# Patient Record
Sex: Female | Born: 1965 | Race: White | Hispanic: No | Marital: Married
Health system: Southern US, Community
[De-identification: ages and names within clinical notes are randomized; demographics above are authoritative.]

---

## 1997-10-06 ENCOUNTER — Inpatient Hospital Stay (HOSPITAL_COMMUNITY): Admission: AD | Admit: 1997-10-06 | Discharge: 1997-10-08 | Payer: Self-pay | Admitting: Obstetrics & Gynecology

## 1997-11-10 ENCOUNTER — Other Ambulatory Visit: Admission: RE | Admit: 1997-11-10 | Discharge: 1997-11-10 | Payer: Self-pay | Admitting: Obstetrics & Gynecology

## 1998-12-11 ENCOUNTER — Other Ambulatory Visit: Admission: RE | Admit: 1998-12-11 | Discharge: 1998-12-11 | Payer: Self-pay | Admitting: Obstetrics and Gynecology

## 2000-01-07 ENCOUNTER — Other Ambulatory Visit: Admission: RE | Admit: 2000-01-07 | Discharge: 2000-01-07 | Payer: Self-pay | Admitting: Obstetrics & Gynecology

## 2001-02-09 ENCOUNTER — Other Ambulatory Visit: Admission: RE | Admit: 2001-02-09 | Discharge: 2001-02-09 | Payer: Self-pay | Admitting: Obstetrics & Gynecology

## 2002-06-14 ENCOUNTER — Other Ambulatory Visit: Admission: RE | Admit: 2002-06-14 | Discharge: 2002-06-14 | Payer: Self-pay | Admitting: Obstetrics and Gynecology

## 2003-08-12 ENCOUNTER — Other Ambulatory Visit: Admission: RE | Admit: 2003-08-12 | Discharge: 2003-08-12 | Payer: Self-pay | Admitting: Obstetrics and Gynecology

## 2004-12-07 ENCOUNTER — Other Ambulatory Visit: Admission: RE | Admit: 2004-12-07 | Discharge: 2004-12-07 | Payer: Self-pay | Admitting: Obstetrics & Gynecology

## 2013-07-18 ENCOUNTER — Other Ambulatory Visit: Payer: Self-pay | Admitting: Obstetrics & Gynecology

## 2013-07-18 DIAGNOSIS — Z803 Family history of malignant neoplasm of breast: Secondary | ICD-10-CM

## 2013-08-13 ENCOUNTER — Ambulatory Visit
Admission: RE | Admit: 2013-08-13 | Discharge: 2013-08-13 | Disposition: A | Discharge status: Home / Self Care | Payer: 59 | Source: Ambulatory Visit | Attending: Obstetrics & Gynecology | Admitting: Obstetrics & Gynecology

## 2013-08-13 DIAGNOSIS — Z803 Family history of malignant neoplasm of breast: Secondary | ICD-10-CM

## 2013-08-13 MED ORDER — GADOBENATE DIMEGLUMINE 529 MG/ML IV SOLN
10.0000 mL | Freq: Once | INTRAVENOUS | Status: AC | PRN
  Administered 2013-08-13: 10 mL via INTRAVENOUS

## 2014-09-05 ENCOUNTER — Other Ambulatory Visit: Payer: Self-pay | Admitting: Obstetrics & Gynecology

## 2014-09-05 DIAGNOSIS — Z803 Family history of malignant neoplasm of breast: Secondary | ICD-10-CM

## 2014-09-05 DIAGNOSIS — R922 Inconclusive mammogram: Secondary | ICD-10-CM

## 2014-10-06 ENCOUNTER — Ambulatory Visit
Admission: RE | Admit: 2014-10-06 | Discharge: 2014-10-06 | Disposition: A | Discharge status: Home / Self Care | Payer: Self-pay | Source: Ambulatory Visit | Attending: Obstetrics & Gynecology | Admitting: Obstetrics & Gynecology

## 2014-10-06 DIAGNOSIS — R922 Inconclusive mammogram: Secondary | ICD-10-CM

## 2014-10-06 DIAGNOSIS — Z803 Family history of malignant neoplasm of breast: Secondary | ICD-10-CM

## 2014-10-06 MED ORDER — GADOBENATE DIMEGLUMINE 529 MG/ML IV SOLN
10.0000 mL | Freq: Once | INTRAVENOUS | Status: AC | PRN
  Administered 2014-10-06: 10 mL via INTRAVENOUS

## 2015-09-03 ENCOUNTER — Other Ambulatory Visit: Payer: Self-pay | Admitting: Obstetrics & Gynecology

## 2015-09-03 DIAGNOSIS — Z803 Family history of malignant neoplasm of breast: Secondary | ICD-10-CM

## 2015-10-01 ENCOUNTER — Ambulatory Visit
Admission: RE | Admit: 2015-10-01 | Discharge: 2015-10-01 | Disposition: A | Discharge status: Home / Self Care | Payer: 59 | Source: Ambulatory Visit | Attending: Obstetrics & Gynecology | Admitting: Obstetrics & Gynecology

## 2015-10-01 DIAGNOSIS — Z803 Family history of malignant neoplasm of breast: Secondary | ICD-10-CM

## 2015-10-01 MED ORDER — GADOBENATE DIMEGLUMINE 529 MG/ML IV SOLN: 10.0000 mL | Freq: Once | INTRAVENOUS | Status: DC | PRN

## 2016-09-22 ENCOUNTER — Other Ambulatory Visit: Payer: Self-pay | Admitting: Obstetrics & Gynecology

## 2016-09-22 DIAGNOSIS — Z803 Family history of malignant neoplasm of breast: Secondary | ICD-10-CM

## 2016-10-03 ENCOUNTER — Ambulatory Visit
Admission: RE | Admit: 2016-10-03 | Discharge: 2016-10-03 | Disposition: A | Discharge status: Home / Self Care | Payer: 59 | Source: Ambulatory Visit | Attending: Obstetrics & Gynecology | Admitting: Obstetrics & Gynecology

## 2016-10-03 DIAGNOSIS — Z803 Family history of malignant neoplasm of breast: Secondary | ICD-10-CM

## 2016-10-03 MED ORDER — GADOBENATE DIMEGLUMINE 529 MG/ML IV SOLN
10.0000 mL | Freq: Once | INTRAVENOUS | Status: AC | PRN
  Administered 2016-10-03: 10 mL via INTRAVENOUS

## 2017-09-18 ENCOUNTER — Other Ambulatory Visit: Payer: Self-pay | Admitting: Obstetrics & Gynecology

## 2017-09-18 DIAGNOSIS — Z803 Family history of malignant neoplasm of breast: Secondary | ICD-10-CM

## 2017-10-16 ENCOUNTER — Ambulatory Visit
Admission: RE | Admit: 2017-10-16 | Discharge: 2017-10-16 | Disposition: A | Discharge status: Home / Self Care | Payer: Self-pay | Source: Ambulatory Visit | Attending: Obstetrics & Gynecology | Admitting: Obstetrics & Gynecology

## 2017-10-16 DIAGNOSIS — Z803 Family history of malignant neoplasm of breast: Secondary | ICD-10-CM

## 2017-10-16 MED ORDER — GADOBENATE DIMEGLUMINE 529 MG/ML IV SOLN
10.0000 mL | Freq: Once | INTRAVENOUS | Status: AC | PRN
  Administered 2017-10-16: 10 mL via INTRAVENOUS

## 2019-08-09 ENCOUNTER — Other Ambulatory Visit: Payer: Self-pay | Admitting: Obstetrics & Gynecology

## 2019-08-14 ENCOUNTER — Other Ambulatory Visit: Payer: Self-pay | Admitting: Obstetrics & Gynecology

## 2019-08-14 DIAGNOSIS — Z803 Family history of malignant neoplasm of breast: Secondary | ICD-10-CM

## 2019-09-09 ENCOUNTER — Other Ambulatory Visit: Payer: Managed Care, Other (non HMO)

## 2019-09-12 ENCOUNTER — Other Ambulatory Visit: Payer: Managed Care, Other (non HMO)

## 2020-01-14 ENCOUNTER — Other Ambulatory Visit: Payer: BC Managed Care – PPO

## 2020-01-20 ENCOUNTER — Other Ambulatory Visit: Payer: Self-pay | Admitting: Obstetrics & Gynecology

## 2020-01-20 DIAGNOSIS — Z803 Family history of malignant neoplasm of breast: Secondary | ICD-10-CM

## 2020-01-30 ENCOUNTER — Other Ambulatory Visit: Payer: BC Managed Care – PPO

## 2020-02-11 ENCOUNTER — Other Ambulatory Visit: Payer: Self-pay

## 2020-02-11 ENCOUNTER — Ambulatory Visit
Admission: RE | Admit: 2020-02-11 | Discharge: 2020-02-11 | Disposition: A | Discharge status: Home / Self Care | Payer: No Typology Code available for payment source | Source: Ambulatory Visit | Attending: Obstetrics & Gynecology | Admitting: Obstetrics & Gynecology

## 2020-02-11 DIAGNOSIS — Z803 Family history of malignant neoplasm of breast: Secondary | ICD-10-CM

## 2020-02-11 MED ORDER — GADOBUTROL 1 MMOL/ML IV SOLN
5.0000 mL | Freq: Once | INTRAVENOUS | Status: AC | PRN
  Administered 2020-02-11: 5 mL via INTRAVENOUS

## 2021-08-04 ENCOUNTER — Other Ambulatory Visit: Payer: Self-pay | Admitting: Obstetrics & Gynecology

## 2021-08-04 DIAGNOSIS — Z803 Family history of malignant neoplasm of breast: Secondary | ICD-10-CM

## 2021-08-25 ENCOUNTER — Ambulatory Visit
Admission: RE | Admit: 2021-08-25 | Discharge: 2021-08-25 | Disposition: A | Discharge status: Home / Self Care | Payer: Self-pay | Source: Ambulatory Visit | Attending: Obstetrics & Gynecology | Admitting: Obstetrics & Gynecology

## 2021-08-25 DIAGNOSIS — Z803 Family history of malignant neoplasm of breast: Secondary | ICD-10-CM

## 2021-08-25 MED ORDER — GADOBUTROL 1 MMOL/ML IV SOLN
5.0000 mL | Freq: Once | INTRAVENOUS | Status: AC | PRN
  Administered 2021-08-25: 5 mL via INTRAVENOUS

## 2022-05-22 IMAGING — MR MR BREAST BILAT WO/W CM
8 of 12 series · 33 of 48 positions shown · IV contrast (gadavist)
Comparison: Previous exam(s).

CLINICAL DATA: Patient with elevated lifetime risk of breast
cancer.

EXAM:
BILATERAL BREAST MRI WITH AND WITHOUT CONTRAST
TECHNIQUE: Multiplanar, multisequence MR images of both breasts were obtained
prior to and following the intravenous administration of 5 ml of
Gadavist

[Series 2: t2_tirm_tra ipat (a-p) · axial · 3.0mm · 0.70mm/px · 1 of 58 slices shown]
[im 1/58]
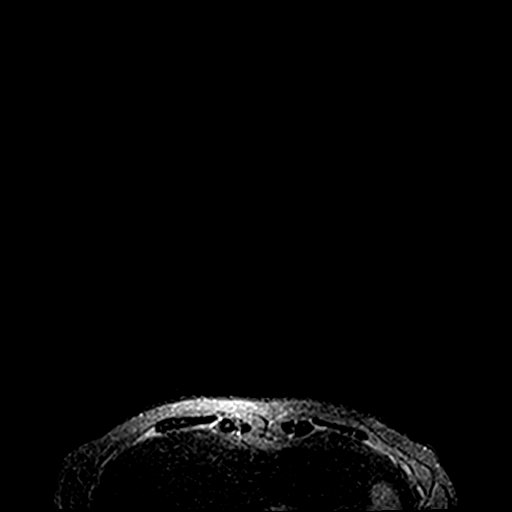

[Series 3: fl3d pre-cm no · axial · non-contrast · 1.2mm · 0.94mm/px · z∈[-64,+107]mm · 5 of 144 slices shown]
[im 1/144]
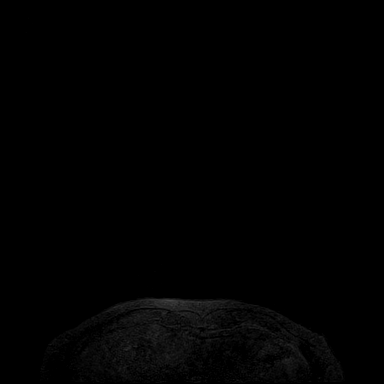
[im 36/144]
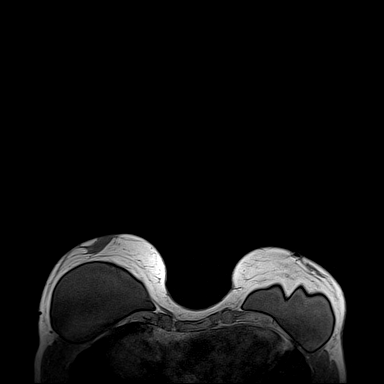
[im 72/144]
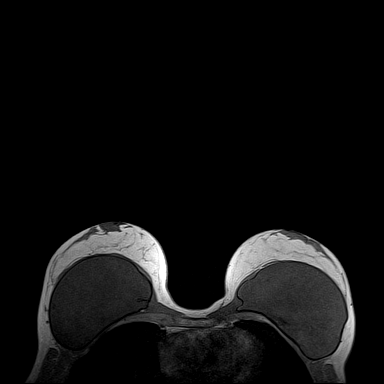
[im 108/144]
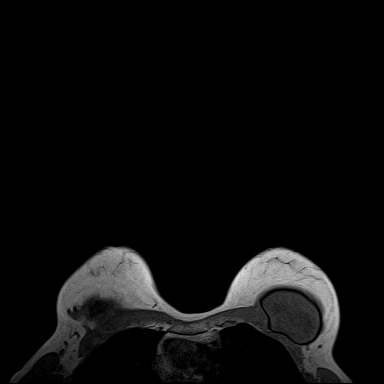
[im 144/144]
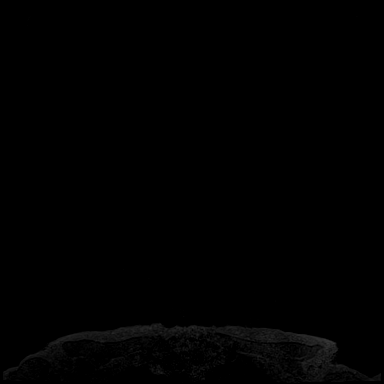

[Series 4: fl3d pre-cm · axial · non-contrast · 1.2mm · 0.94mm/px · z∈[-64,+107]mm · 5 of 144 slices shown]
[im 1/144]
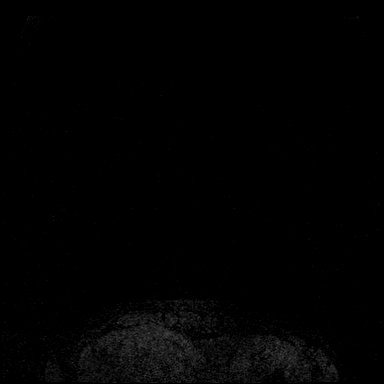
[im 36/144]
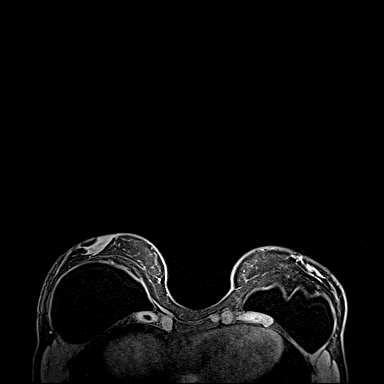
[im 72/144]
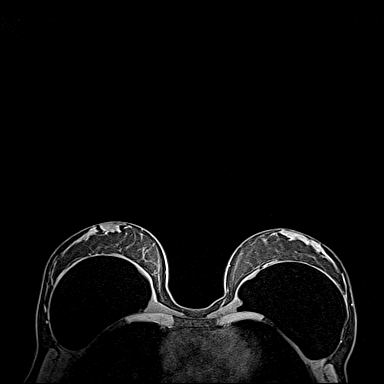
[im 108/144]
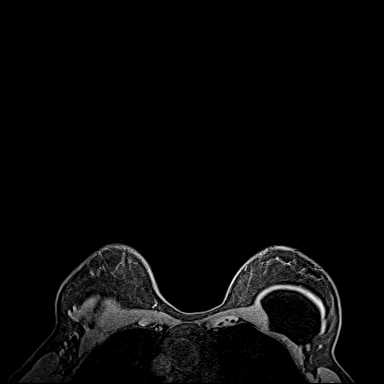
[im 144/144]
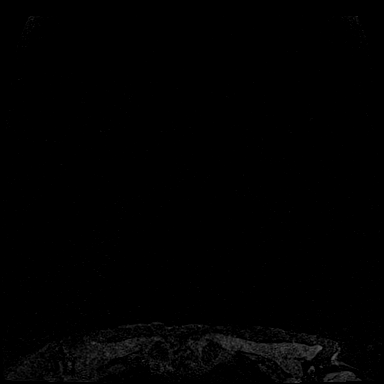

[Series 5: fl3d post immediate · axial · 1.2mm · 0.94mm/px · z∈[-64,+107]mm · 5 of 144 slices shown (1 of 3)]
[im 1/144]
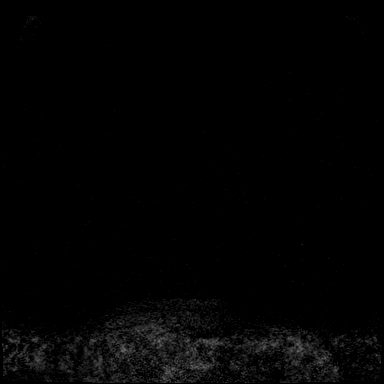
[im 36/144]
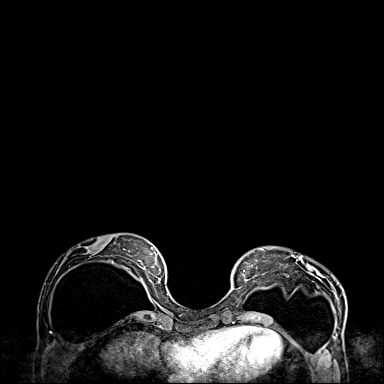
[im 72/144]
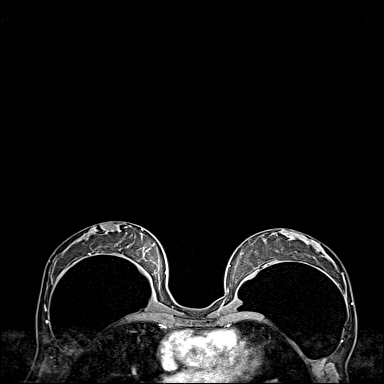
[im 108/144]
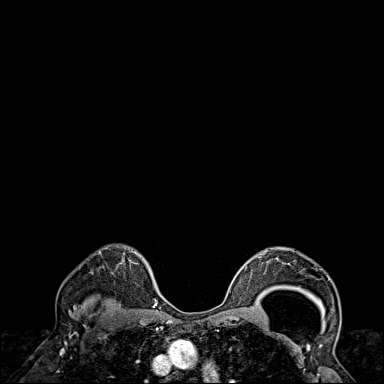
[im 144/144]
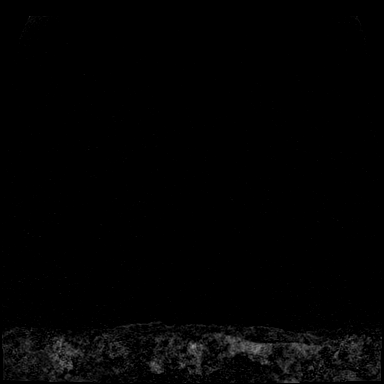

[Series 6: fl3d post immediate · axial · 1.2mm · 0.94mm/px · z∈[-64,+107]mm · 5 of 144 slices shown (2 of 3)]
[im 1/144]
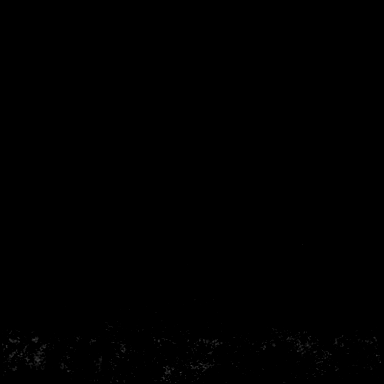
[im 36/144]
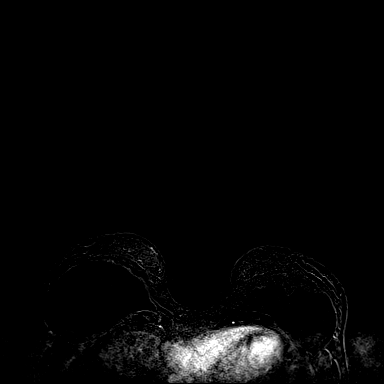
[im 72/144]
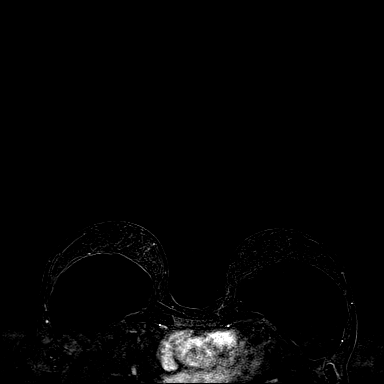
[im 108/144]
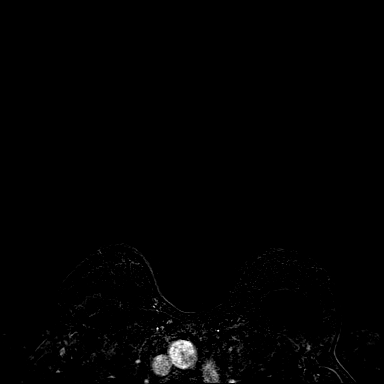
[im 144/144]
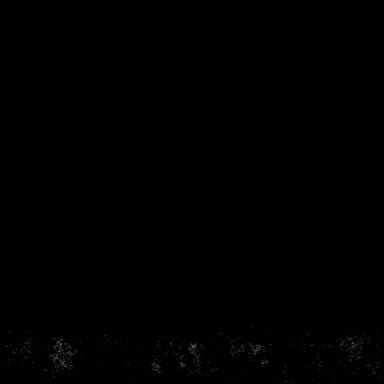

[Series 7: fl3d post immediate · axial · 172.8mm · 0.94mm/px · 1 of 1 slices shown (3 of 3)]
[im 1/1]
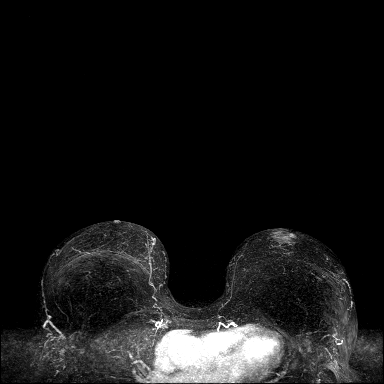

[Series 8: fl3d post 3min · axial · 1.2mm · 0.94mm/px · z∈[-64,+107]mm · 6 of 144 slices shown]
[im 1/144]
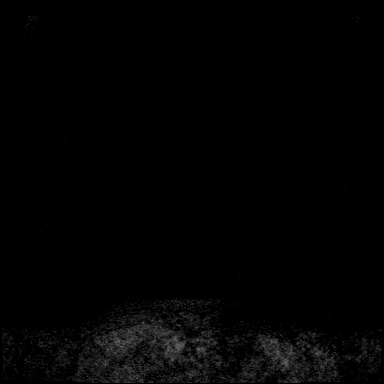
[im 29/144]
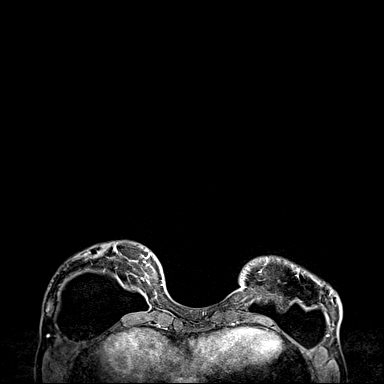
[im 58/144]
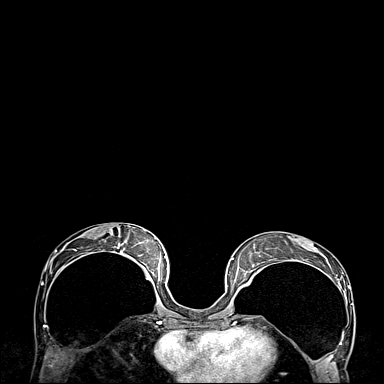
[im 86/144]
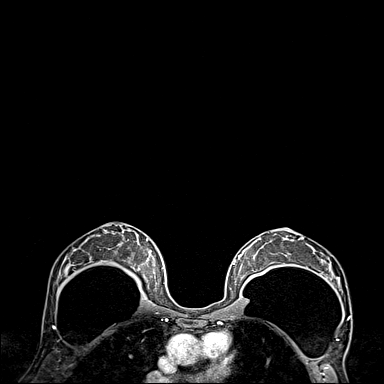
[im 115/144]
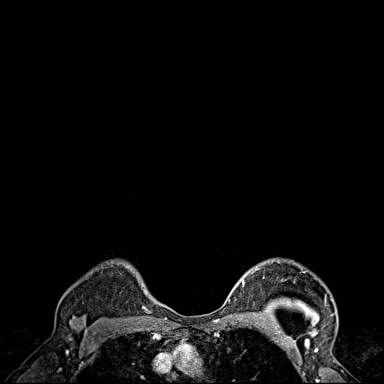
[im 144/144]
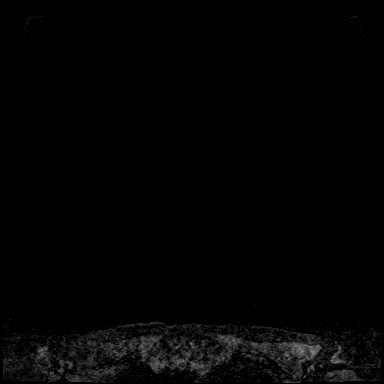

[Series 9: fl3d post 3min_sub · axial · 1.2mm · 0.94mm/px · z∈[-64,+72]mm · 5 of 144 slices shown]
[im 1/144]
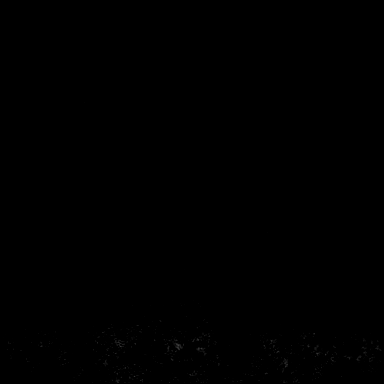
[im 29/144]
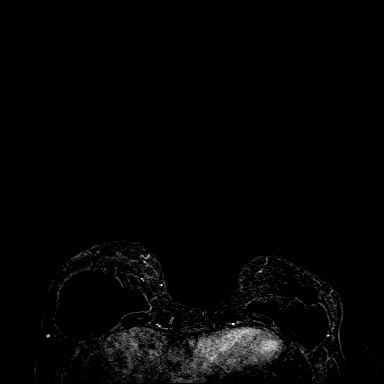
[im 58/144]
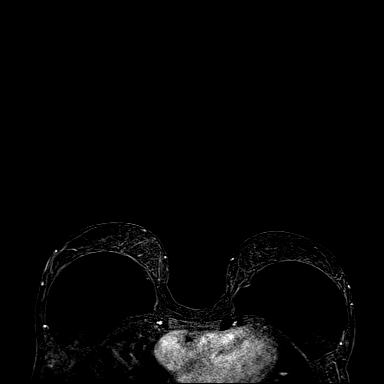
[im 86/144]
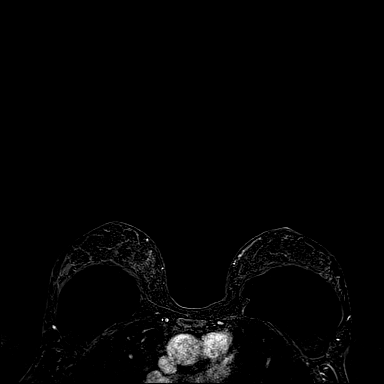
[im 115/144]
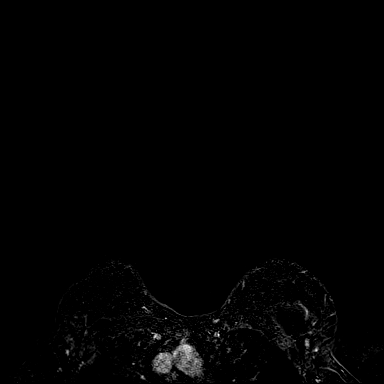

[33 of 48 positions shown; findings below may reference images not displayed]

Three-dimensional MR images were rendered by post-processing of the
original MR data on an independent workstation. The
three-dimensional MR images were interpreted, and findings are
reported in the following complete MRI report for this study. Three
dimensional images were evaluated at the independent interpreting
workstation using the [employer] thin client.
FINDINGS: Breast composition: c. Heterogeneous fibroglandular tissue.

Background parenchymal enhancement: Minimal

Right breast: No mass or abnormal enhancement.

Left breast: No mass or abnormal enhancement.

Lymph nodes: No abnormal appearing lymph nodes.

Ancillary findings: Bilateral retropectoral silicone implants are
demonstrated. Subcapsular line sign and keyhole sign suggested
within the right implant, raising the possibility intracapsular
rupture of the right implant.
IMPRESSION: No abnormal enhancement within either breast to suggest malignancy.

Findings within the right silicone implant suggestive of
intracapsular rupture. A dedicated noncontrast enhanced implant
protocol MRI can be obtained as clinically indicated.

RECOMMENDATION:
Continue with annual screening mammography [DATE].

Based on the recommendations of the [employer], annual
screening MRI is suggested in addition to annual mammography if the
patient has an estimated lifetime risk of developing breast cancer
which is greater than 20%.

BI-RADS CATEGORY  1: Negative.
